# Patient Record
Sex: Female | Born: 2005 | Race: White | Hispanic: No | Marital: Single | Smoking: Never smoker
Health system: Southern US, Community
[De-identification: ages and names within clinical notes are randomized; demographics above are authoritative.]

## PROBLEM LIST (undated history)

## (undated) ENCOUNTER — Emergency Department (HOSPITAL_COMMUNITY): Discharge status: Absent without leave | Payer: Medicaid Other

## (undated) DIAGNOSIS — G4733 Obstructive sleep apnea (adult) (pediatric): Secondary | ICD-10-CM

## (undated) DIAGNOSIS — G40909 Epilepsy, unspecified, not intractable, without status epilepticus: Secondary | ICD-10-CM

## (undated) DIAGNOSIS — R569 Unspecified convulsions: Secondary | ICD-10-CM

## (undated) DIAGNOSIS — R7303 Prediabetes: Secondary | ICD-10-CM

## (undated) DIAGNOSIS — H539 Unspecified visual disturbance: Secondary | ICD-10-CM

---
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
## 2024-04-18 ENCOUNTER — Encounter: Payer: Self-pay | Admitting: Pediatrics
